# Patient Record
Sex: Female | Born: 1985 | Race: White | Hispanic: No | Marital: Single | Smoking: Former smoker
Health system: Southern US, Community
[De-identification: ages and names within clinical notes are randomized; demographics above are authoritative.]

## PROBLEM LIST (undated history)

## (undated) DIAGNOSIS — Z9151 Personal history of suicidal behavior: Secondary | ICD-10-CM

## (undated) DIAGNOSIS — N83209 Unspecified ovarian cyst, unspecified side: Secondary | ICD-10-CM

## (undated) DIAGNOSIS — F419 Anxiety disorder, unspecified: Secondary | ICD-10-CM

## (undated) DIAGNOSIS — O039 Complete or unspecified spontaneous abortion without complication: Secondary | ICD-10-CM

## (undated) DIAGNOSIS — N3010 Interstitial cystitis (chronic) without hematuria: Secondary | ICD-10-CM

## (undated) DIAGNOSIS — Z915 Personal history of self-harm: Secondary | ICD-10-CM

## (undated) HISTORY — PX: LAPAROSCOPIC OVARIAN CYSTECTOMY: SUR786

## (undated) HISTORY — PX: SALPINGECTOMY: SHX328

---

## 2019-04-01 ENCOUNTER — Encounter (HOSPITAL_COMMUNITY): Payer: Self-pay | Admitting: *Deleted

## 2019-04-01 ENCOUNTER — Other Ambulatory Visit: Payer: Self-pay

## 2019-04-01 ENCOUNTER — Emergency Department (HOSPITAL_COMMUNITY)
Admission: EM | Admit: 2019-04-01 | Discharge: 2019-04-01 | Disposition: A | Discharge status: Home / Self Care | Payer: Self-pay | Attending: Emergency Medicine | Admitting: Emergency Medicine

## 2019-04-01 DIAGNOSIS — Z91040 Latex allergy status: Secondary | ICD-10-CM | POA: Insufficient documentation

## 2019-04-01 DIAGNOSIS — L0291 Cutaneous abscess, unspecified: Secondary | ICD-10-CM

## 2019-04-01 DIAGNOSIS — F1210 Cannabis abuse, uncomplicated: Secondary | ICD-10-CM | POA: Insufficient documentation

## 2019-04-01 DIAGNOSIS — Z79899 Other long term (current) drug therapy: Secondary | ICD-10-CM | POA: Insufficient documentation

## 2019-04-01 DIAGNOSIS — L0201 Cutaneous abscess of face: Secondary | ICD-10-CM | POA: Insufficient documentation

## 2019-04-01 DIAGNOSIS — F17210 Nicotine dependence, cigarettes, uncomplicated: Secondary | ICD-10-CM | POA: Insufficient documentation

## 2019-04-01 HISTORY — DX: Unspecified ovarian cyst, unspecified side: N83.209

## 2019-04-01 HISTORY — DX: Anxiety disorder, unspecified: F41.9

## 2019-04-01 HISTORY — DX: Personal history of suicidal behavior: Z91.51

## 2019-04-01 HISTORY — DX: Personal history of self-harm: Z91.5

## 2019-04-01 HISTORY — DX: Interstitial cystitis (chronic) without hematuria: N30.10

## 2019-04-01 HISTORY — DX: Complete or unspecified spontaneous abortion without complication: O03.9

## 2019-04-01 LAB — POC URINE PREG, ED: Preg Test, Ur: NEGATIVE

## 2019-04-01 MED ORDER — CLINDAMYCIN HCL 150 MG PO CAPS: 450.0000 mg | ORAL_CAPSULE | Freq: Three times a day (TID) | ORAL | 0 refills | Status: AC

## 2019-04-01 MED ORDER — CLINDAMYCIN HCL 150 MG PO CAPS: 450.0000 mg | ORAL_CAPSULE | Freq: Three times a day (TID) | ORAL | 0 refills | Status: DC

## 2019-04-01 MED ORDER — CLINDAMYCIN HCL 150 MG PO CAPS
300.0000 mg | ORAL_CAPSULE | Freq: Once | ORAL | Status: AC
  Administered 2019-04-01: 300 mg via ORAL
  Filled 2019-04-01: qty 2

## 2019-04-01 MED ORDER — HYDROCODONE-ACETAMINOPHEN 5-325 MG PO TABS: 1.0000 | ORAL_TABLET | ORAL | 0 refills | Status: DC | PRN

## 2019-04-01 MED ORDER — HYDROCODONE-ACETAMINOPHEN 5-325 MG PO TABS: 1.0000 | ORAL_TABLET | ORAL | 0 refills | Status: AC | PRN

## 2019-04-01 MED ORDER — LIDOCAINE-EPINEPHRINE (PF) 2 %-1:200000 IJ SOLN
10.0000 mL | Freq: Once | INTRAMUSCULAR | Status: AC
  Administered 2019-04-01: 10 mL
  Filled 2019-04-01: qty 10

## 2019-04-01 MED ORDER — HYDROCODONE-ACETAMINOPHEN 5-325 MG PO TABS
1.0000 | ORAL_TABLET | Freq: Once | ORAL | Status: AC
  Administered 2019-04-01: 1 via ORAL
  Filled 2019-04-01: qty 1

## 2019-04-01 MED ORDER — PENTAFLUOROPROP-TETRAFLUOROETH EX AERO
INHALATION_SPRAY | Freq: Once | CUTANEOUS | Status: AC
  Administered 2019-04-01: 1 via TOPICAL
  Filled 2019-04-01: qty 116

## 2019-04-01 NOTE — ED Triage Notes (Signed)
Pt c/o abscess to chin x 2 weeks. Pt reports she got plywood in her chin while she was working and she pulled it out but ever since then her chin has continued to swell, become red and painful. Pt has used Tramadol, Vicodin, benadryl, cortisone cream and triple antibiotic cream to treat it at home. Unknown fever. Pt also reports vomiting several times yesterday and feeling nauseated today.
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
## 2019-04-01 NOTE — Discharge Instructions (Addendum)
Keep this area clean and dry, covered.  I recommend warm water compresses as we discussed 2-3 times daily for 10 minutes to help facilitate continued drainage and to help with antibiotic better penetrate this area.  Take the entire course of antibiotics prescribed.  Do not drive within 4 hours of taking hydrocodone as this medication will make you drowsy.

## 2019-04-01 NOTE — ED Notes (Signed)
Suture tray, betadine, and medications at bedside.

## 2019-06-02 ENCOUNTER — Encounter (HOSPITAL_COMMUNITY): Payer: Self-pay | Admitting: Emergency Medicine

## 2019-06-02 ENCOUNTER — Other Ambulatory Visit: Payer: Self-pay

## 2019-06-02 ENCOUNTER — Emergency Department (HOSPITAL_COMMUNITY): Payer: Self-pay

## 2019-06-02 ENCOUNTER — Emergency Department (HOSPITAL_COMMUNITY)
Admission: EM | Admit: 2019-06-02 | Discharge: 2019-06-02 | Disposition: A | Discharge status: Home / Self Care | Payer: Self-pay | Attending: Emergency Medicine | Admitting: Emergency Medicine

## 2019-06-02 DIAGNOSIS — Z79899 Other long term (current) drug therapy: Secondary | ICD-10-CM | POA: Insufficient documentation

## 2019-06-02 DIAGNOSIS — Z91040 Latex allergy status: Secondary | ICD-10-CM | POA: Insufficient documentation

## 2019-06-02 DIAGNOSIS — Z87891 Personal history of nicotine dependence: Secondary | ICD-10-CM | POA: Insufficient documentation

## 2019-06-02 DIAGNOSIS — N83201 Unspecified ovarian cyst, right side: Secondary | ICD-10-CM | POA: Insufficient documentation

## 2019-06-02 LAB — CBC
HCT: 39.4 % (ref 36.0–46.0)
Hemoglobin: 12.7 g/dL (ref 12.0–15.0)
MCH: 29.3 pg (ref 26.0–34.0)
MCHC: 32.2 g/dL (ref 30.0–36.0)
MCV: 91 fL (ref 80.0–100.0)
Platelets: 240 10*3/uL (ref 150–400)
RBC: 4.33 MIL/uL (ref 3.87–5.11)
RDW: 13.2 % (ref 11.5–15.5)
WBC: 7.5 10*3/uL (ref 4.0–10.5)
nRBC: 0 % (ref 0.0–0.2)

## 2019-06-02 LAB — COMPREHENSIVE METABOLIC PANEL
ALT: 14 U/L (ref 0–44)
AST: 16 U/L (ref 15–41)
Albumin: 4.3 g/dL (ref 3.5–5.0)
Alkaline Phosphatase: 71 U/L (ref 38–126)
Anion gap: 12 (ref 5–15)
BUN: 9 mg/dL (ref 6–20)
CO2: 25 mmol/L (ref 22–32)
Calcium: 10 mg/dL (ref 8.9–10.3)
Chloride: 102 mmol/L (ref 98–111)
Creatinine, Ser: 0.5 mg/dL (ref 0.44–1.00)
GFR calc Af Amer: >60 mL/min (ref >60)
GFR calc non Af Amer: >60 mL/min (ref >60)
Glucose, Bld: 93 mg/dL (ref 70–99)
Potassium: 4.2 mmol/L (ref 3.5–5.1)
Sodium: 139 mmol/L (ref 135–145)
Total Bilirubin: 0.4 mg/dL (ref 0.3–1.2)
Total Protein: 7.4 g/dL (ref 6.5–8.1)

## 2019-06-02 LAB — URINALYSIS, ROUTINE W REFLEX MICROSCOPIC
Bilirubin Urine: NEGATIVE
Glucose, UA: NEGATIVE mg/dL
Hgb urine dipstick: NEGATIVE
Ketones, ur: NEGATIVE mg/dL
Leukocytes,Ua: NEGATIVE
Nitrite: NEGATIVE
Protein, ur: NEGATIVE mg/dL
Specific Gravity, Urine: 1.019 (ref 1.005–1.030)
pH: 6 (ref 5.0–8.0)

## 2019-06-02 LAB — LIPASE, BLOOD: Lipase: 24 U/L (ref 11–51)

## 2019-06-02 LAB — WET PREP, GENITAL
Clue Cells Wet Prep HPF POC: NONE SEEN
Sperm: NONE SEEN
Trich, Wet Prep: NONE SEEN
Yeast Wet Prep HPF POC: NONE SEEN

## 2019-06-02 LAB — PREGNANCY, URINE: Preg Test, Ur: NEGATIVE

## 2019-06-02 MED ORDER — SODIUM CHLORIDE 0.9% FLUSH: 3.0000 mL | Freq: Once | INTRAVENOUS | Status: DC

## 2019-06-02 NOTE — ED Notes (Signed)
Patient transported to Ultrasound 

## 2019-06-02 NOTE — ED Triage Notes (Signed)
Patient reports abdominal and back pain x 2 weeks. States she was seen at the health clinic for positive pregnancy test.

## 2019-06-02 NOTE — Discharge Instructions (Signed)
Take over-the-counter medications such as ibuprofen or Tylenol as needed for pain. Apply heating pad to your abdomen for some symptom relief. It is recommended you have a repeat ultrasound in 6 to 12 months by your OB/GYN specialist to ensure resolution of the cyst. Schedule an appointment with an OB/GYN specialist as referred, to discuss your diagnosis and your concerns regarding conceiving.

## 2019-06-02 NOTE — ED Notes (Signed)
Pt returned from US
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
## 2019-06-02 NOTE — ED Notes (Signed)
ED Provider at bedside. 

## 2019-06-03 LAB — GC/CHLAMYDIA PROBE AMP (~~LOC~~) NOT AT ARMC
Chlamydia: NEGATIVE
Neisseria Gonorrhea: NEGATIVE

## 2020-05-30 IMAGING — US TRANSVAGINAL ULTRASOUND OF PELVIS
1 series · 13 of 25 positions shown · non-contrast
Comparison: None available.

CLINICAL DATA: Initial evaluation for acute right lower quadrant
pain.

EXAM:
TRANSABDOMINAL AND TRANSVAGINAL ULTRASOUND OF PELVIS
DOPPLER ULTRASOUND OF OVARIES
TECHNIQUE: Both transabdominal and transvaginal ultrasound examinations of the
pelvis were performed. Transabdominal technique was performed for
global imaging of the pelvis including uterus, ovaries, adnexal
regions, and pelvic cul-de-sac.
It was necessary to proceed with endovaginal exam following the
transabdominal exam to visualize the uterus, endometrium, and
ovaries. Color and duplex Doppler ultrasound was utilized to
evaluate blood flow to the ovaries.

[Series 1: transvaginal ultrasound of pelvis · 13 of 114 slices shown]
[im 1/114]
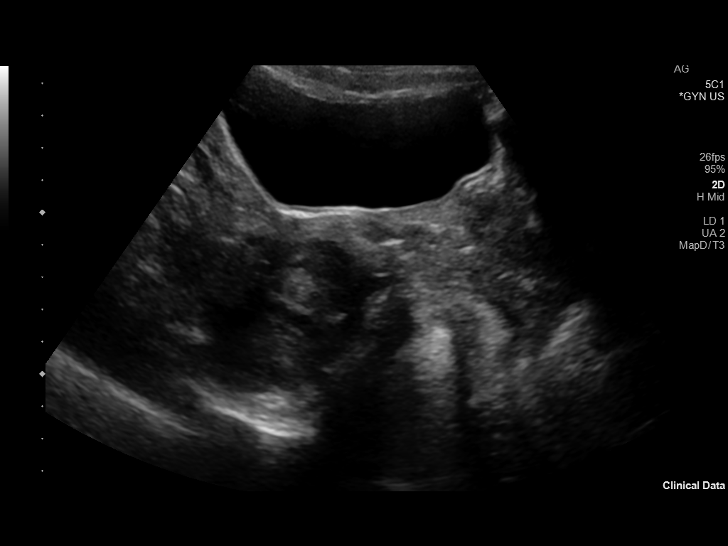
[im 10/114]
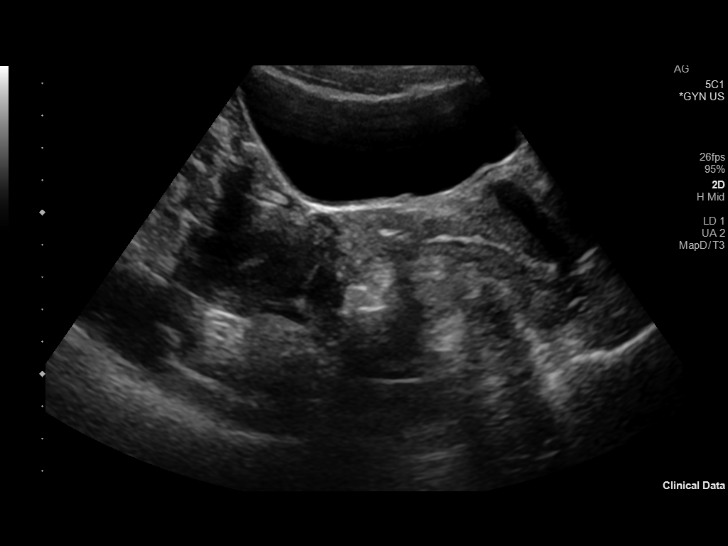
[im 19/114]
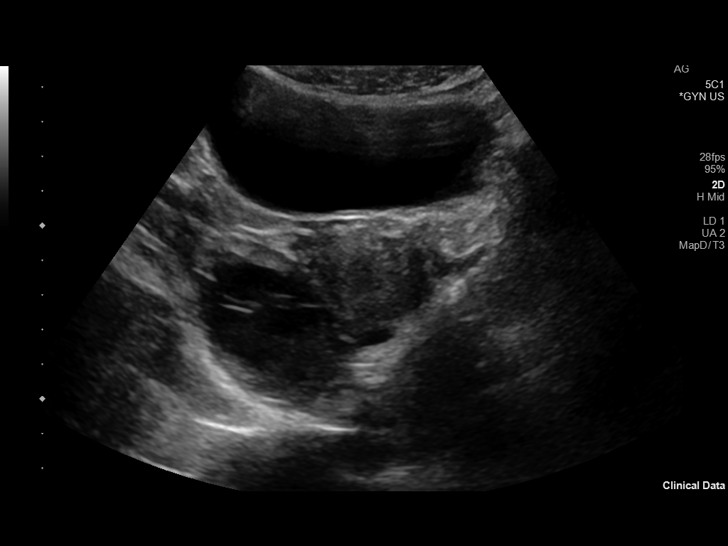
[im 29/114]
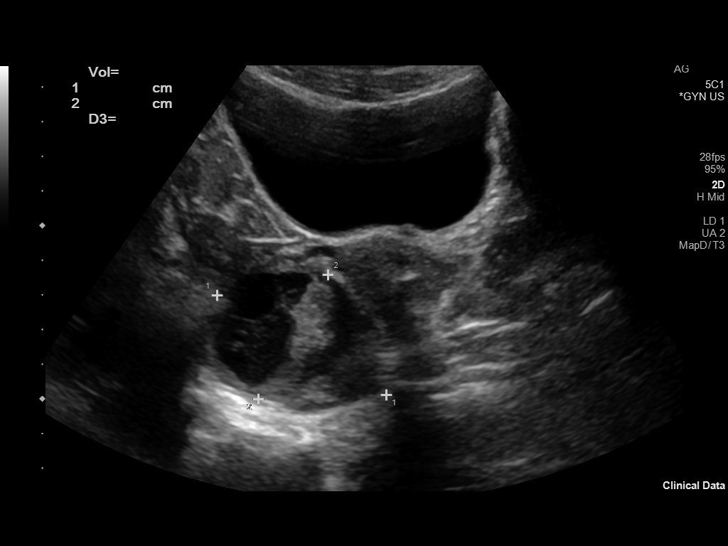
[im 38/114]
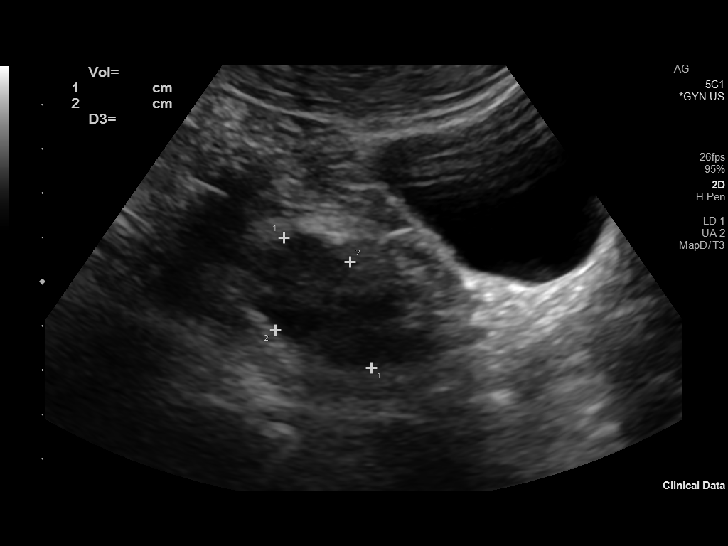
[im 48/114]
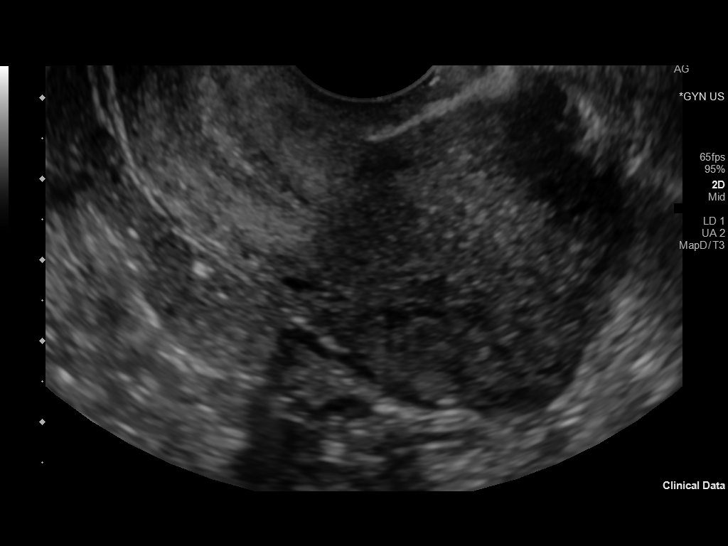
[im 57/114]
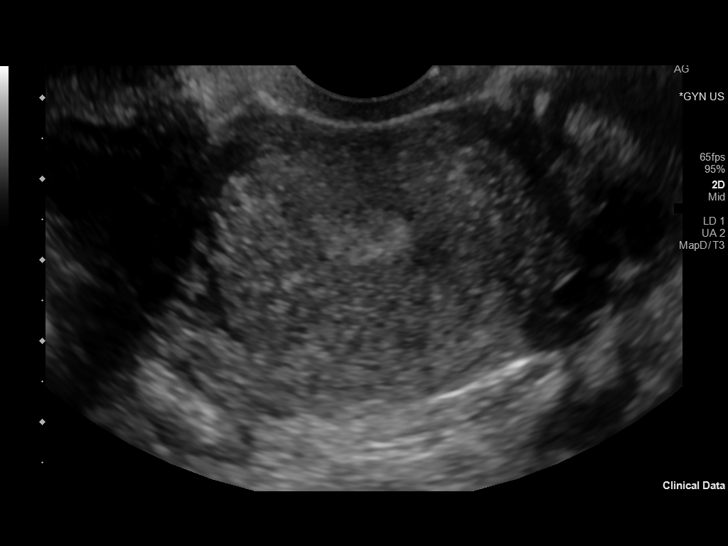
[im 66/114]
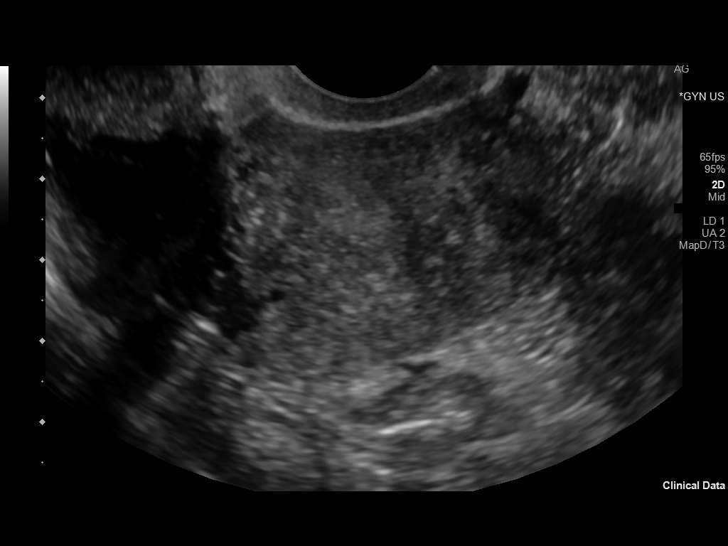
[im 76/114]
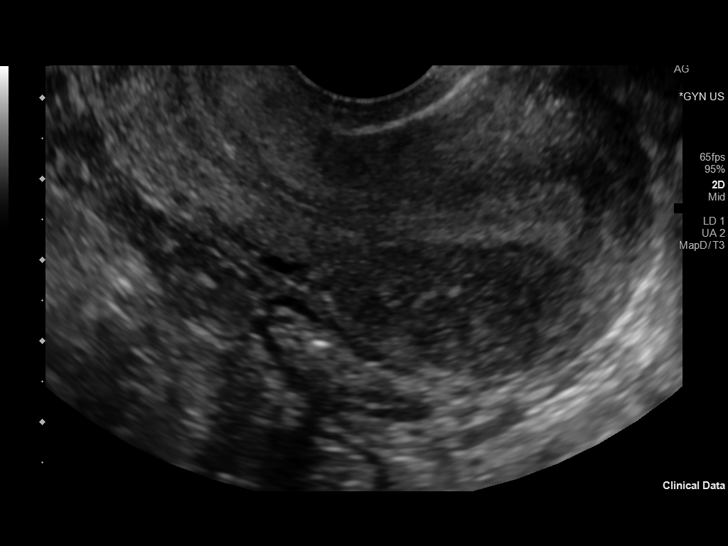
[im 85/114]
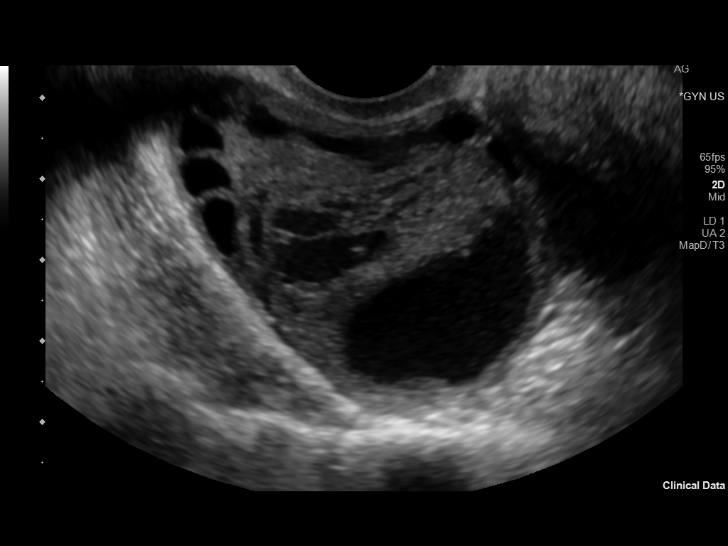
[im 95/114]
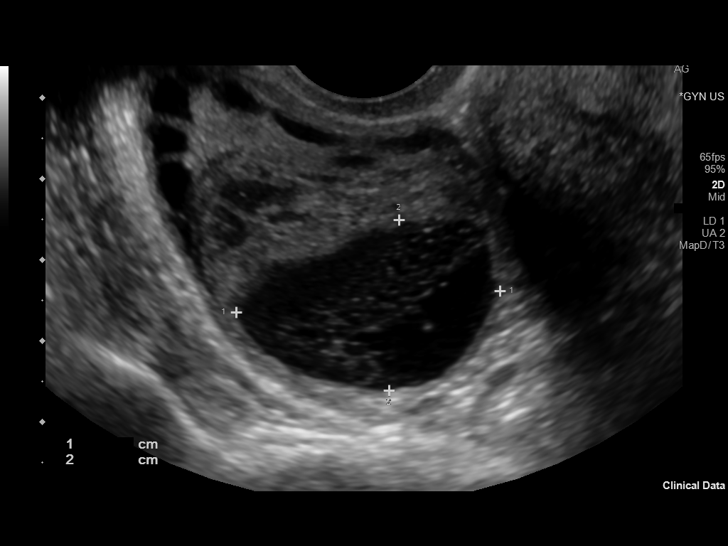
[im 104/114]
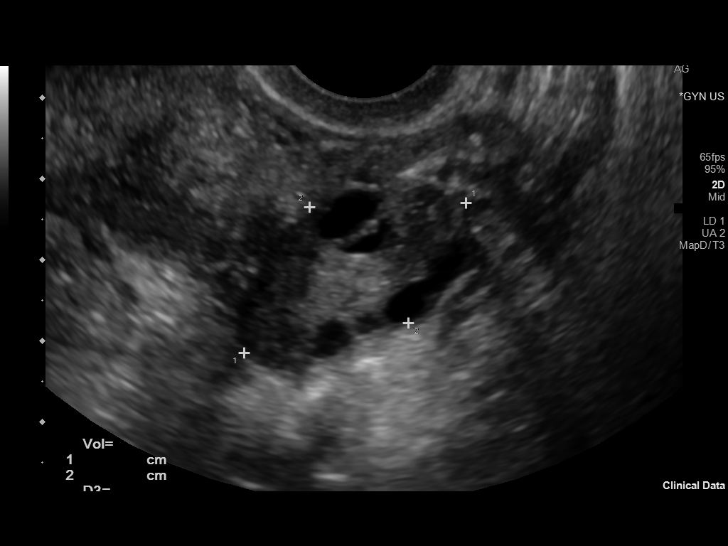
[im 114/114]
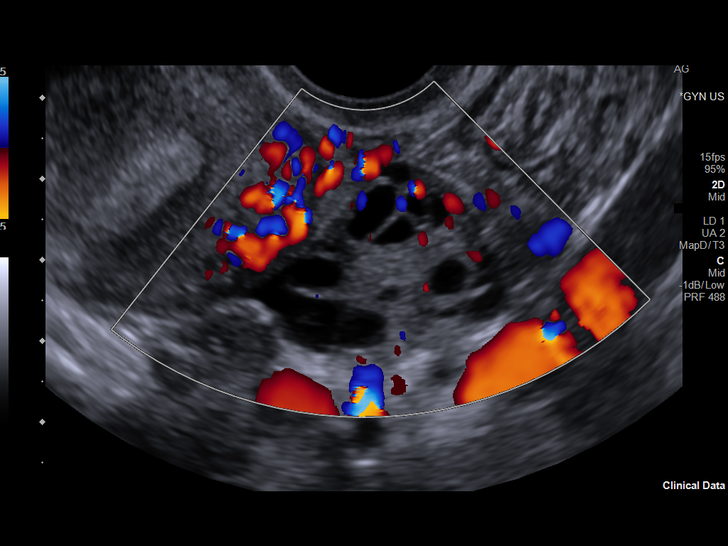

[13 of 25 positions shown; findings below may reference images not displayed]

FINDINGS: Uterus

Measurements: 7.3 x 3.9 x 4.5 cm = volume: 67.6 mL. No fibroids or
other mass visualized.

Endometrium

Thickness: 9.6 mm.  No focal abnormality visualized.

Right ovary

Measurements: 5.3 x 3.9 x 4.9 cm = volume: 52.7 mL. 3.4 x 2.8 x
cm complex cystic lesion within the right ovary. Lesion demonstrates
internal lace-like architecture without internal vascularity or
solid component. Appearance most characteristic of a hemorrhagic
cyst.

Left ovary

Measurements: 3.3 x 1.9 x 3.1 cm = volume: 10.2 mL. Normal
appearance/no adnexal mass.

Pulsed Doppler evaluation of both ovaries demonstrates normal
low-resistance arterial and venous waveforms.

Other findings

No abnormal free fluid.
IMPRESSION: 1. 4.1 cm complex right ovarian cyst, most compatible with a
hemorrhagic cyst. While this is almost certainly benign, a short
interval follow-up ultrasound in 6-12 weeks to ensure resolution
could be performed as clinically warranted.
2. Otherwise unremarkable and normal pelvic ultrasound. No evidence
for ovarian torsion.

## 2021-07-10 ENCOUNTER — Encounter (HOSPITAL_COMMUNITY): Payer: Self-pay

## 2021-07-10 ENCOUNTER — Telehealth (HOSPITAL_COMMUNITY): Payer: Self-pay

## 2021-07-10 ENCOUNTER — Telehealth (HOSPITAL_COMMUNITY): Payer: 59 | Admitting: Psychiatry

## 2021-07-10 NOTE — Telephone Encounter (Signed)
Medication management - Telephone call with patient to inform Dr. Gilmore Laroche was working her in on 07/11/21 at 9:30 am and to be at the office by 9:00 am to complete any necessary paperwork.  Pt. agreed with plan and stated would be there and reviewed address and directions.  Patient agreed to bring any recent hospital discharge information with her to the appointment.

## 2021-07-10 NOTE — Telephone Encounter (Signed)
I Got things worked out and spoke to Debra Bean.  She will be here in the morning at 9am for a 9:15 appt.  I provided our address and gave her directions to our office. Informed her which building we were in.   Nothing Further Needed at this time.

## 2021-07-10 NOTE — Telephone Encounter (Signed)
Appointment - message left for paitent to try to inform her of arranged appointment for 9:00 am on 07/11/21 in office with Dr. Gilmore Laroche.  Requested patient call our office back as soon as possible to confirm appointment
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
## 2021-07-11 ENCOUNTER — Encounter (HOSPITAL_COMMUNITY): Payer: Self-pay | Admitting: Psychiatry

## 2021-07-11 ENCOUNTER — Ambulatory Visit (INDEPENDENT_AMBULATORY_CARE_PROVIDER_SITE_OTHER): Payer: 59 | Admitting: Psychiatry

## 2021-07-11 VITALS — BP 98/70 | Temp 98.6°F | Ht 64.0 in | Wt 138.0 lb

## 2021-07-11 DIAGNOSIS — F4321 Adjustment disorder with depressed mood: Secondary | ICD-10-CM

## 2021-07-11 DIAGNOSIS — F3160 Bipolar disorder, current episode mixed, unspecified: Secondary | ICD-10-CM

## 2021-07-11 DIAGNOSIS — F411 Generalized anxiety disorder: Secondary | ICD-10-CM | POA: Diagnosis not present

## 2021-07-11 MED ORDER — MIRTAZAPINE 15 MG PO TABS: 15.0000 mg | ORAL_TABLET | Freq: Every day | ORAL | 0 refills | Status: DC

## 2021-07-11 MED ORDER — OLANZAPINE 15 MG PO TABS: 15.0000 mg | ORAL_TABLET | Freq: Every day | ORAL | 0 refills | Status: DC

## 2021-07-11 MED ORDER — HYDROXYZINE HCL 10 MG PO TABS: 10.0000 mg | ORAL_TABLET | Freq: Two times a day (BID) | ORAL | 0 refills | Status: DC | PRN
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
## 2021-07-16 ENCOUNTER — Ambulatory Visit (INDEPENDENT_AMBULATORY_CARE_PROVIDER_SITE_OTHER): Payer: 59 | Admitting: Licensed Clinical Social Worker

## 2021-07-16 ENCOUNTER — Other Ambulatory Visit: Payer: Self-pay

## 2021-07-16 DIAGNOSIS — F4321 Adjustment disorder with depressed mood: Secondary | ICD-10-CM | POA: Diagnosis not present

## 2021-07-16 DIAGNOSIS — F3160 Bipolar disorder, current episode mixed, unspecified: Secondary | ICD-10-CM | POA: Diagnosis not present

## 2021-07-16 DIAGNOSIS — F411 Generalized anxiety disorder: Secondary | ICD-10-CM
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
## 2021-07-30 ENCOUNTER — Telehealth (HOSPITAL_COMMUNITY): Payer: Self-pay | Admitting: *Deleted

## 2021-07-30 ENCOUNTER — Ambulatory Visit (INDEPENDENT_AMBULATORY_CARE_PROVIDER_SITE_OTHER): Payer: 59 | Admitting: Licensed Clinical Social Worker

## 2021-07-30 ENCOUNTER — Other Ambulatory Visit (HOSPITAL_COMMUNITY): Payer: Self-pay | Admitting: *Deleted

## 2021-07-30 DIAGNOSIS — F3160 Bipolar disorder, current episode mixed, unspecified: Secondary | ICD-10-CM | POA: Diagnosis not present

## 2021-07-30 MED ORDER — MIRTAZAPINE 15 MG PO TABS: 15.0000 mg | ORAL_TABLET | Freq: Every day | ORAL | 0 refills | Status: DC

## 2021-07-30 MED ORDER — OLANZAPINE 15 MG PO TABS: 15.0000 mg | ORAL_TABLET | Freq: Every day | ORAL | 0 refills | Status: DC

## 2021-07-30 NOTE — Telephone Encounter (Signed)
Pt seen by Ivin Booty today and requesting refill Rx mirtazapine 15 mg and Olanzapine 15 mg.  Last OV 07/16/21 Last refill-07/11/21

## 2021-07-30 NOTE — Telephone Encounter (Signed)
Sent  Rx  Mirtazepine 15 mg and Olanzapine 15 mg [facility] Verbal order by Dr. Gilmore Laroche
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
## 2021-08-12 ENCOUNTER — Telehealth (HOSPITAL_COMMUNITY): Payer: Self-pay | Admitting: Psychiatry

## 2021-08-12 NOTE — Telephone Encounter (Signed)
Pt calling and left message.  She is complaining about not being able to sleep at night.  She is wanting gabapentin.  She is taking atarax bid and complaining about this as well.   Please advise.   CB [phone]

## 2021-08-13 MED ORDER — HYDROXYZINE HCL 10 MG PO TABS: 10.0000 mg | ORAL_TABLET | Freq: Three times a day (TID) | ORAL | 0 refills | Status: DC | PRN

## 2021-08-13 NOTE — Telephone Encounter (Signed)
Informed patient of everything Dr. Gilmore Laroche stated in previous message. I sent the vistaril TID to the pharmacy. Patient will call back if needed. Nothing further is needed at this time.

## 2021-08-22 ENCOUNTER — Ambulatory Visit (INDEPENDENT_AMBULATORY_CARE_PROVIDER_SITE_OTHER): Payer: 59 | Admitting: Licensed Clinical Social Worker

## 2021-08-22 DIAGNOSIS — F3160 Bipolar disorder, current episode mixed, unspecified: Secondary | ICD-10-CM | POA: Diagnosis not present

## 2021-08-23 NOTE — Progress Notes (Signed)
   THERAPIST PROGRESS NOTE  Session Time: 1:00 pm-1:45 pm  Type of Therapy: Individual Therapy  Purpose of session: Charlise will manage mood and anxiety as evidenced by coping with and processing grief, reducing anger for 5 out of 7 days for 60 days.  Interventions: Therapist utilized CBT and Solution focused brief therapy to address mood and anxiety. Therapist provided support and empathy to patient during session. Therapist had patient identify what has gone well since last session. Therapist discussed with patient her probation.   Effectiveness: Patient was oriented x5 (person, place, situation, time, and object). Patient was alert, engaged, pleasant, and cooperative. Patient was casually dressed and appropriately groomed. Patient was much more sedated than previous session. Patient noted she is feeling better. She is dealing with the loss of her family member, and is working again. She failed a recent drug test with her probation officer. She admitted to smoking marijuana on a regular basis. Patient also noted that she is spending time with her grandfather. She is happy to spend time with him and wants to take care of him. Patient asked to move session frequently to monthly since she is feeling better. Patient also admitted to getting xanax or klonopin off the street if she needs it.    Patient engaged in session. They responded well to interventions. Patient continues to meet criteria for Bipolar I disorder, most recent episode mixed and Generalized Anxiety Disorder. Patient will continue in outpatient therapy due to being the least restrictive service to meet her needs. Patient made minimal progress on her goals at this time.   Suicidal/Homicidal: Nowithout intent/plan  Plan: Return again in 1-2 weeks.  Diagnosis: Axis I: Bipolar, mixed and Generalized Anxiety Disorder    Axis II: No diagnosis    Bynum Bellows, LCSW 08/23/2021

## 2021-09-03 ENCOUNTER — Ambulatory Visit (INDEPENDENT_AMBULATORY_CARE_PROVIDER_SITE_OTHER): Payer: 59 | Admitting: Psychiatry

## 2021-09-03 ENCOUNTER — Encounter (HOSPITAL_COMMUNITY): Payer: Self-pay | Admitting: Psychiatry

## 2021-09-03 DIAGNOSIS — F411 Generalized anxiety disorder: Secondary | ICD-10-CM

## 2021-09-03 DIAGNOSIS — F4321 Adjustment disorder with depressed mood: Secondary | ICD-10-CM

## 2021-09-03 DIAGNOSIS — F3160 Bipolar disorder, current episode mixed, unspecified: Secondary | ICD-10-CM | POA: Diagnosis not present

## 2021-09-03 MED ORDER — MIRTAZAPINE 15 MG PO TABS: 15.0000 mg | ORAL_TABLET | Freq: Every day | ORAL | 1 refills | Status: AC

## 2021-09-03 MED ORDER — BUSPIRONE HCL 7.5 MG PO TABS: 30.0000 mg | ORAL_TABLET | Freq: Every day | ORAL | 1 refills | Status: DC

## 2021-09-03 MED ORDER — OLANZAPINE 15 MG PO TABS: 15.0000 mg | ORAL_TABLET | Freq: Every day | ORAL | 1 refills | Status: DC
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
## 2021-09-05 ENCOUNTER — Ambulatory Visit (HOSPITAL_COMMUNITY): Payer: 59 | Admitting: Licensed Clinical Social Worker

## 2021-09-19 ENCOUNTER — Ambulatory Visit (INDEPENDENT_AMBULATORY_CARE_PROVIDER_SITE_OTHER): Payer: 59 | Admitting: Licensed Clinical Social Worker

## 2021-09-19 DIAGNOSIS — F3160 Bipolar disorder, current episode mixed, unspecified: Secondary | ICD-10-CM | POA: Diagnosis not present

## 2021-09-20 NOTE — Progress Notes (Signed)
   THERAPIST PROGRESS NOTE  Session Time: 1:00 pm-1:20 pm  Type of Therapy: Individual Therapy  Purpose of session: Debra Bean will manage mood and anxiety as evidenced by coping with and processing grief, reducing anger for 5 out of 7 days for 60 days.  Interventions: Therapist utilized CBT and Solution focused brief therapy to address mood and anxiety. Therapist provided support and empathy to patient during session. Therapist explored patient's feelings to identify triggers for mood. Therapist checked in on patient's grief.   Effectiveness: Patient was oriented x5 (person, place, situation, time, and object). Patient was alert, guarded, pleasant, and cooperative. Patient was dressed casually, and appropriately groomed. Patient was tired and not in a "talking mood." Patient is sleeping through the night but has some disrupted sleep due to stressing over her bills. She was able to get her bills paid and her stress has reduced. Patient passed her drug test for probation. She has to pay a large fee for probation. Patient was focused on working and getting back to work. Patient is worried about the holidays and grief. She recognizes that it will be difficult.   Patient engaged in session. They responded well to interventions. Patient continues to meet criteria for Bipolar I disorder, most recent episode mixed and Generalized Anxiety Disorder. Patient will continue in outpatient therapy due to being the least restrictive service to meet her needs. Patient made minimal progress on her goals at this time.   Suicidal/Homicidal: Nowithout intent/plan  Plan: Return again in 1-2 weeks.  Diagnosis: Axis I: Bipolar, mixed and Generalized Anxiety Disorder    Axis II: No diagnosis    Bynum Bellows, LCSW 09/20/2021

## 2021-10-17 ENCOUNTER — Telehealth (HOSPITAL_COMMUNITY): Payer: Self-pay | Admitting: Psychiatry

## 2021-10-17 ENCOUNTER — Ambulatory Visit (INDEPENDENT_AMBULATORY_CARE_PROVIDER_SITE_OTHER): Payer: 59 | Admitting: Licensed Clinical Social Worker

## 2021-10-17 DIAGNOSIS — F411 Generalized anxiety disorder: Secondary | ICD-10-CM | POA: Diagnosis not present

## 2021-10-17 DIAGNOSIS — F3160 Bipolar disorder, current episode mixed, unspecified: Secondary | ICD-10-CM

## 2021-10-17 DIAGNOSIS — F4321 Adjustment disorder with depressed mood: Secondary | ICD-10-CM | POA: Diagnosis not present

## 2021-10-17 NOTE — Telephone Encounter (Signed)
Left vm stating that she stopped taking the  busPIRone (BUSPAR) - due to it giving her panic attacks and she wanted to speak with her doctor.   # [phone]

## 2021-10-18 MED ORDER — HYDROXYZINE HCL 10 MG PO TABS: 10.0000 mg | ORAL_TABLET | Freq: Two times a day (BID) | ORAL | 0 refills | Status: DC | PRN

## 2021-10-18 NOTE — Progress Notes (Signed)
   THERAPIST PROGRESS NOTE  Session Time: 2:00 pm-2:45 pm  Type of Therapy: Individual Therapy  Purpose of session: Debra Bean will manage mood and anxiety as evidenced by coping with and processing grief, reducing anger for 5 out of 7 days for 60 days.  Interventions: Therapist utilized CBT and Solution focused brief therapy to address mood and anxiety. Therapist provided support and empathy to patient during session. Therapist explored patient's triggers for mood and anxiety. Therapist worked with patient to identify small steps to manage her mood.   Effectiveness: Patient was oriented x5 (person, place, situation, time, and object). Patient was alert, engaged, anxious, and cooperative. Patient was casually dressed, and appropriately groomed. Patient has been feeling down, and her "nerves have been torn up." Patient has been spending most of her time in bed. She is staying home and overthinking. Patient feels like her medication is not working. Patient feels like she needs gabapentin, xanax, or clonopin. She has been smoking marijuana. She also admits to using meth, and crack/cocaine one evening with a friend and has not used since. Patient is scattered. She is going to try to get a stable part time job, working outside of the home, and with the public. She feels like this will help. Patient continues to feel grief as well. She feels like the holidays will be difficult without her mother.   Patient engaged in session. They responded well to interventions. Patient continues to meet criteria for Bipolar I disorder, most recent episode mixed and Generalized Anxiety Disorder. Patient will continue in outpatient therapy due to being the least restrictive service to meet her needs. Patient made minimal progress on her goals at this time.   Suicidal/Homicidal: Nowithout intent/plan  Plan: Return again in 1-2 weeks.  Diagnosis: Axis I: Bipolar, mixed and Generalized Anxiety Disorder    Axis II: No  diagnosis    Bynum Bellows, LCSW 10/18/2021

## 2021-10-29 ENCOUNTER — Ambulatory Visit (HOSPITAL_COMMUNITY): Payer: 59 | Admitting: Psychiatry

## 2021-11-28 ENCOUNTER — Telehealth (INDEPENDENT_AMBULATORY_CARE_PROVIDER_SITE_OTHER): Payer: 59 | Admitting: Psychiatry

## 2021-11-28 ENCOUNTER — Encounter (HOSPITAL_COMMUNITY): Payer: Self-pay | Admitting: Psychiatry

## 2021-11-28 DIAGNOSIS — F411 Generalized anxiety disorder: Secondary | ICD-10-CM | POA: Diagnosis not present

## 2021-11-28 DIAGNOSIS — F3160 Bipolar disorder, current episode mixed, unspecified: Secondary | ICD-10-CM

## 2021-11-28 MED ORDER — HYDROXYZINE HCL 10 MG PO TABS: 10.0000 mg | ORAL_TABLET | Freq: Every day | ORAL | 0 refills | Status: AC | PRN

## 2021-11-28 MED ORDER — OLANZAPINE 10 MG PO TABS: 10.0000 mg | ORAL_TABLET | Freq: Every day | ORAL | 1 refills | Status: AC
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
## 2021-12-12 ENCOUNTER — Ambulatory Visit (HOSPITAL_COMMUNITY): Payer: 59 | Admitting: Licensed Clinical Social Worker

## 2022-01-09 ENCOUNTER — Ambulatory Visit (HOSPITAL_COMMUNITY): Payer: Self-pay | Admitting: Psychiatry

## 2022-01-10 ENCOUNTER — Ambulatory Visit (HOSPITAL_COMMUNITY): Payer: 59 | Admitting: Licensed Clinical Social Worker

## 2022-02-03 ENCOUNTER — Telehealth (HOSPITAL_COMMUNITY): Payer: 59 | Admitting: Psychiatry
